# Patient Record
Sex: Female | Born: 1940 | Race: White | Hispanic: No | Marital: Married | Smoking: Former smoker
Health system: Southern US, Community
[De-identification: ages and names within clinical notes are randomized; demographics above are authoritative.]

## PROBLEM LIST (undated history)

## (undated) DIAGNOSIS — E785 Hyperlipidemia, unspecified: Secondary | ICD-10-CM

## (undated) DIAGNOSIS — I10 Essential (primary) hypertension: Secondary | ICD-10-CM

## (undated) HISTORY — PX: FRACTURE SURGERY: SHX138

---

## 2006-11-04 ENCOUNTER — Ambulatory Visit: Payer: Self-pay | Admitting: Internal Medicine

## 2009-09-11 ENCOUNTER — Ambulatory Visit: Payer: Self-pay | Admitting: Internal Medicine

## 2011-04-09 ENCOUNTER — Inpatient Hospital Stay: Payer: Self-pay | Admitting: Specialist

## 2011-10-09 ENCOUNTER — Ambulatory Visit: Payer: Self-pay | Admitting: Internal Medicine

## 2011-11-27 ENCOUNTER — Ambulatory Visit: Payer: Self-pay | Admitting: Internal Medicine

## 2011-11-28 ENCOUNTER — Ambulatory Visit: Payer: Self-pay | Admitting: Internal Medicine

## 2012-04-16 ENCOUNTER — Ambulatory Visit: Payer: Self-pay | Admitting: Ophthalmology

## 2012-04-16 LAB — POTASSIUM: Potassium: 4 mmol/L (ref 3.5–5.1)

## 2012-04-27 ENCOUNTER — Ambulatory Visit: Payer: Self-pay | Admitting: Ophthalmology

## 2012-12-03 ENCOUNTER — Ambulatory Visit: Payer: Self-pay | Admitting: Internal Medicine

## 2013-12-06 ENCOUNTER — Ambulatory Visit: Payer: Self-pay | Admitting: Internal Medicine

## 2015-04-02 NOTE — Op Note (Signed)
PATIENT NAME:  Jordan Armstrong, Jordan Armstrong MR#:  [id number] DATE OF BIRTH:  July 08, 1941  DATE OF PROCEDURE:  04/27/2012  PREOPERATIVE DIAGNOSIS: Cataract, right eye.   POSTOPERATIVE DIAGNOSIS: Cataract, right eye.   PROCEDURE PERFORMED: Extracapsular cataract extraction using phacoemulsification with placement of an Alcon [id number], 24-diopter posterior chamber lens, serial # [id number].   SURGEON: Maylon PeppersSteven A. Mabrey Howland, M.D.   ANESTHESIA: 4% lidocaine, 0.75% Marcaine in a 50/50 mixture with 10 units/mL of Hylenex added, given as a peribulbar.   ANESTHESIOLOGIST: Dr. Pernell DupreAdams   COMPLICATIONS: None.   ESTIMATED BLOOD LOSS: Less than 1 mL.   DESCRIPTION OF PROCEDURE:  The patient was brought to the operating room and given a peribulbar block.  The patient was then prepped and draped in the usual fashion.  The vertical rectus muscles were imbricated using 5-0 silk sutures.  These sutures were then clamped to the sterile drapes as bridle sutures.  A limbal peritomy was performed extending two clock hours and hemostasis was obtained with cautery.  A partial thickness scleral groove was made at the surgical limbus and dissected anteriorly in a lamellar dissection using an Alcon crescent knife.  The anterior chamber was entered superonasally with a Superblade and through the lamellar dissection with a 2.6 mm keratome.  DisCoVisc was used to replace the aqueous and a continuous tear capsulorrhexis was carried out.  Hydrodissection and hydrodelineation were carried out with balanced salt and a 27 gauge canula.  The nucleus was rotated to confirm the effectiveness of the hydrodissection.  Phacoemulsification was carried out using a divide-and-conquer technique.  Total ultrasound time was 1 minute and 18 seconds with an average power of 20.8 percent. CDE 28.72.  Irrigation/aspiration was used to remove the residual cortex.  DisCoVisc was used to inflate the capsule and the internal incision was enlarged to 3 mm with  the crescent knife.  The intraocular lens was folded and inserted into the capsular bag using the Acrysert delivery system.  Irrigation/aspiration was used to remove the residual DisCoVisc.  Miostat was injected into the anterior chamber through the paracentesis track to inflate the anterior chamber and induce miosis.  The wound was checked for leaks and none were found. The conjunctiva was closed with cautery and the bridle sutures were removed.  Two drops of 0.3% Vigamox were placed on the eye.   An eye shield was placed on the eye.  The patient was discharged to the recovery room in good condition.   ____________________________ Maylon PeppersSteven A. Tykwon Fera, MD sad:bjt D: 04/27/2012 12:49:57 ET T: 04/27/2012 13:07:07 ET JOB#: [id number]  cc: Viviann SpareSteven A. Mignonne Afonso, MD, <Dictator> Erline LevineSTEVEN A Lonzy Mato MD ELECTRONICALLY SIGNED 05/01/2012 12:02

## 2015-09-25 ENCOUNTER — Emergency Department
Admission: EM | Admit: 2015-09-25 | Discharge: 2015-09-25 | Disposition: A | Discharge status: Home / Self Care | Payer: Medicare Other | Attending: Emergency Medicine | Admitting: Emergency Medicine

## 2015-09-25 ENCOUNTER — Emergency Department: Payer: Medicare Other

## 2015-09-25 DIAGNOSIS — I10 Essential (primary) hypertension: Secondary | ICD-10-CM | POA: Insufficient documentation

## 2015-09-25 DIAGNOSIS — R410 Disorientation, unspecified: Secondary | ICD-10-CM | POA: Diagnosis not present

## 2015-09-25 DIAGNOSIS — R4182 Altered mental status, unspecified: Secondary | ICD-10-CM | POA: Diagnosis present

## 2015-09-25 DIAGNOSIS — Z87891 Personal history of nicotine dependence: Secondary | ICD-10-CM | POA: Insufficient documentation

## 2015-09-25 HISTORY — DX: Essential (primary) hypertension: I10

## 2015-09-25 HISTORY — DX: Hyperlipidemia, unspecified: E78.5

## 2015-09-25 LAB — CBC WITH DIFFERENTIAL/PLATELET
BASOS PCT: 1 %
Basophils Absolute: 0 10*3/uL (ref 0–0.1)
EOS ABS: 0.2 10*3/uL (ref 0–0.7)
EOS PCT: 4 %
HCT: 44.8 % (ref 35.0–47.0)
HEMOGLOBIN: 15.3 g/dL (ref 12.0–16.0)
LYMPHS ABS: 0.4 10*3/uL — AB (ref 1.0–3.6)
Lymphocytes Relative: 8 %
MCH: 31.9 pg (ref 26.0–34.0)
MCHC: 34.1 g/dL (ref 32.0–36.0)
MCV: 93.3 fL (ref 80.0–100.0)
Monocytes Absolute: 0.3 10*3/uL (ref 0.2–0.9)
Monocytes Relative: 6 %
NEUTROS PCT: 81 %
Neutro Abs: 4.6 10*3/uL (ref 1.4–6.5)
PLATELETS: 205 10*3/uL (ref 150–440)
RBC: 4.8 MIL/uL (ref 3.80–5.20)
RDW: 13.1 % (ref 11.5–14.5)
WBC: 5.7 10*3/uL (ref 3.6–11.0)

## 2015-09-25 LAB — COMPREHENSIVE METABOLIC PANEL
ALBUMIN: 4.2 g/dL (ref 3.5–5.0)
ALK PHOS: 77 U/L (ref 38–126)
ALT: 12 U/L — AB (ref 14–54)
ANION GAP: 9 (ref 5–15)
AST: 17 U/L (ref 15–41)
BUN: 12 mg/dL (ref 6–20)
CHLORIDE: 98 mmol/L — AB (ref 101–111)
CO2: 28 mmol/L (ref 22–32)
Calcium: 11 mg/dL — ABNORMAL HIGH (ref 8.9–10.3)
Creatinine, Ser: 1.1 mg/dL — ABNORMAL HIGH (ref 0.44–1.00)
GFR calc non Af Amer: 48 mL/min — ABNORMAL LOW (ref >60)
GFR, EST AFRICAN AMERICAN: 56 mL/min — AB (ref >60)
GLUCOSE: 105 mg/dL — AB (ref 65–99)
Potassium: 3.9 mmol/L (ref 3.5–5.1)
SODIUM: 135 mmol/L (ref 135–145)
Total Bilirubin: 1.1 mg/dL (ref 0.3–1.2)
Total Protein: 7.2 g/dL (ref 6.5–8.1)

## 2015-09-25 LAB — URINALYSIS COMPLETE WITH MICROSCOPIC (ARMC ONLY)
BILIRUBIN URINE: NEGATIVE
Bacteria, UA: NONE SEEN
GLUCOSE, UA: NEGATIVE mg/dL
Hgb urine dipstick: NEGATIVE
Ketones, ur: NEGATIVE mg/dL
LEUKOCYTES UA: NEGATIVE
NITRITE: NEGATIVE
Protein, ur: NEGATIVE mg/dL
RBC / HPF: NONE SEEN RBC/hpf (ref 0–5)
Specific Gravity, Urine: 1.017 (ref 1.005–1.030)
pH: 6 (ref 5.0–8.0)

## 2015-09-25 LAB — TROPONIN I: Troponin I: <0.03 ng/mL (ref <0.031)

## 2015-09-25 NOTE — ED Notes (Addendum)
Pt here with daughter N law who states the pt is confused, lives with her husband, the pt states "I thought he was dead", husband is alive and was at the home today..states she has had confusion for the past 2 weeks and pt refuses to see her PCP.Marland Kitchen. Pt denies pain or other sx.

## 2015-09-25 NOTE — Discharge Instructions (Signed)
Please seek medical attention for any high fevers, chest pain, shortness of breath, change in behavior, persistent vomiting, bloody stool or any other new or concerning symptoms. ° ° °Confusion °Confusion is the inability to think with your usual speed or clarity. Confusion may come on quickly or slowly over time. How quickly the confusion comes on depends on the cause. Confusion can be due to any number of causes. °CAUSES  °· Concussion, head injury, or head trauma. °· Seizures. °· Stroke. °· Fever. °· Brain tumor. °· Age related decreased brain function (dementia). °· Heightened emotional states like rage or terror. °· Mental illness in which the person loses the ability to determine what is real and what is not (hallucinations). °· Infections such as a urinary tract infection (UTI). °· Toxic effects from alcohol, drugs, or prescription medicines. °· Dehydration and an imbalance of salts in the body (electrolytes). °· Lack of sleep. °· Low blood sugar (diabetes). °· Low levels of oxygen from conditions such as chronic lung disorders. °· Drug interactions or other medicine side effects. °· Nutritional deficiencies, especially niacin, thiamine, vitamin C, or vitamin B. °· Sudden drop in body temperature (hypothermia). °· Change in routine, such as when traveling or hospitalized. °SIGNS AND SYMPTOMS  °People often describe their thinking as cloudy or unclear when they are confused. Confusion can also include feeling disoriented. That means you are unaware of where or who you are. You may also not know what the date or time is. If confused, you may also have difficulty paying attention, remembering, and making decisions. Some people also act aggressively when they are confused.  °DIAGNOSIS  °The medical evaluation of confusion may include: °· Blood and urine tests. °· X-rays. °· Brain and nervous system tests. °· Analyzing your brain waves (electroencephalogram or EEG). °· Magnetic resonance imaging (MRI) of your  head. °· Computed tomography (CT) scan of your head. °· Mental status tests in which your health care provider may ask many questions. Some of these questions may seem silly or strange, but they are a very important test to help diagnose and treat confusion. °TREATMENT  °An admission to the hospital may not be needed, but a person with confusion should not be left alone. Stay with a family member or friend until the confusion clears. Avoid alcohol, pain relievers, or sedative drugs until you have fully recovered. Do not drive until directed by your health care provider. °HOME CARE INSTRUCTIONS  °What family and friends can do: °· To find out if someone is confused, ask the person to state his or her name, age, and the date. If the person is unsure or answers incorrectly, he or she is confused. °· Always introduce yourself, no matter how well the person knows you. °· Often remind the person of his or her location. °· Place a calendar and clock near the confused person. °· Help the person with his or her medicines. You may want to use a pill box, an alarm as a reminder, or give the person each dose as prescribed. °· Talk about current events and plans for the day. °· Try to keep the environment calm, quiet, and peaceful. °· Make sure the person keeps follow-up visits with his or her health care provider. °PREVENTION  °Ways to prevent confusion: °· Avoid alcohol. °· Eat a balanced diet. °· Get enough sleep. °· Take medicine only as directed by your health care provider. °· Do not become isolated. Spend time with other people and make plans for your days. °·   Keep careful watch on your blood sugar levels if you are diabetic. SEEK IMMEDIATE MEDICAL CARE IF:   You develop severe headaches, repeated vomiting, seizures, blackouts, or slurred speech.  There is increasing confusion, weakness, numbness, restlessness, or personality changes.  You develop a loss of balance, have marked dizziness, feel uncoordinated, or  fall.  You have delusions, hallucinations, or develop severe anxiety.  Your family members think you need to be rechecked.   This information is not intended to replace advice given to you by your health care provider. Make sure you discuss any questions you have with your health care provider.   Document Released: 01/02/2005 Document Revised: 12/16/2014 Document Reviewed: 12/31/2013 Elsevier Interactive Patient Education 2016 Elsevier Inc. Dementia Dementia is a general term for problems with brain function. A person with dementia has memory loss and a hard time with at least one other brain function such as thinking, speaking, or problem solving. Dementia can affect social functioning, how you do your job, your mood, or your personality. The changes may be hidden for a long time. The earliest forms of this disease are usually not detected by family or friends. Dementia can be:  Irreversible.  Potentially reversible.  Partially reversible.  Progressive. This means it can get worse over time. CAUSES  Irreversible dementia causes may include:  Degeneration of brain cells (Alzheimer disease or Lewy body dementia).  Multiple small strokes (vascular dementia).  Infection (chronic meningitis or Creutzfeldt-Jakob disease).  Frontotemporal dementia. This affects younger people, age 69 to 17, compared to those who have Alzheimer disease.  Dementia associated with other disorders like Parkinson disease, Huntington disease, or HIV-associated dementia. Potentially or partially reversible dementia causes may include:  Medicines.  Metabolic causes such as excessive alcohol intake, vitamin B12 deficiency, or thyroid disease.  Masses or pressure in the brain such as a tumor, blood clot, or hydrocephalus. SIGNS AND SYMPTOMS  Symptoms are often hard to detect. Family members or coworkers may not notice them early in the disease process. Different people with dementia may have different  symptoms. Symptoms can include:  A hard time with memory, especially recent memory. Long-term memory may not be impaired.  Asking the same question multiple times or forgetting something someone just said.  A hard time speaking your thoughts or finding certain words.  A hard time solving problems or performing familiar tasks (such as how to use a telephone).  Sudden changes in mood.  Changes in personality, especially increasing moodiness or mistrust.  Depression.  A hard time understanding complex ideas that were never a problem in the past. DIAGNOSIS  There are no specific tests for dementia.   Your health care provider may recommend a thorough evaluation. This is because some forms of dementia can be reversible. The evaluation will likely include a physical exam and getting a detailed history from you and a family member. The history often gives the best clues and suggestions for a diagnosis.  Memory testing may be done. A detailed brain function evaluation called neuropsychologic testing may be helpful.  Lab tests and brain imaging (such as a CT scan or MRI scan) are sometimes important.  Sometimes observation and re-evaluation over time is very helpful. TREATMENT  Treatment depends on the cause.   If the problem is a vitamin deficiency, it may be helped or cured with supplements.  For dementias such as Alzheimer disease, medicines are available to stabilize or slow the course of the disease. There are no cures for this type of dementia.  Your health care provider can help direct you to groups, organizations, and other health care providers to help with decisions in the care of you or your loved one. HOME CARE INSTRUCTIONS The care of individuals with dementia is varied and dependent upon the progression of the dementia. The following suggestions are intended for the person living with, or caring for, the person with dementia.  Create a safe environment.  Remove the locks  on bathroom doors to prevent the person from accidentally locking himself or herself in.  Use childproof latches on kitchen cabinets and any place where cleaning supplies, chemicals, or alcohol are kept.  Use childproof covers in unused electrical outlets.  Install childproof devices to keep doors and windows secured.  Remove stove knobs or install safety knobs and an automatic shut-off on the stove.  Lower the temperature on water heaters.  Label medicines and keep them locked up.  Secure knives, lighters, matches, power tools, and guns, and keep these items out of reach.  Keep the house free from clutter. Remove rugs or anything that might contribute to a fall.  Remove objects that might break and hurt the person.  Make sure lighting is good, both inside and outside.  Install grab rails as needed.  Use a monitoring device to alert you to falls or other needs for help.  Reduce confusion.  Keep familiar objects and people around.  Use night lights or dim lights at night.  Label items or areas.  Use reminders, notes, or directions for daily activities or tasks.  Keep a simple, consistent routine for waking, meals, bathing, dressing, and bedtime.  Create a calm, quiet environment.  Place large clocks and calendars prominently.  Display emergency numbers and home address near all telephones.  Use cues to establish different times of the day. An example is to open curtains to let the natural light in during the day.   Use effective communication.  Choose simple words and short sentences.  Use a gentle, calm tone of voice.  Be careful not to interrupt.  If the person is struggling to find a word or communicate a thought, try to provide the word or thought.  Ask one question at a time. Allow the person ample time to answer questions. Repeat the question again if the person does not respond.  Reduce nighttime restlessness.  Provide a comfortable bed.  Have a  consistent nighttime routine.  Ensure a regular walking or physical activity schedule. Involve the person in daily activities as much as possible.  Limit napping during the day.  Limit caffeine.  Attend social events that stimulate rather than overwhelm the senses.  Encourage good nutrition and hydration.  Reduce distractions during meal times and snacks.  Avoid foods that are too hot or too cold.  Monitor chewing and swallowing ability.  Continue with routine vision, hearing, dental, and medical screenings.  Give medicines only as directed by the health care provider.  Monitor driving abilities. Do not allow the person to drive when safe driving is no longer possible.  Register with an identification program which could provide location assistance in the event of a missing person situation. SEEK MEDICAL CARE IF:   New behavioral problems start such as moodiness, aggressiveness, or seeing things that are not there (hallucinations).  Any new problem with brain function happens. This includes problems with balance, speech, or falling a lot.  Problems with swallowing develop.  Any symptoms of other illness happen. Small changes or worsening in any aspect of  brain function can be a sign that the illness is getting worse. It can also be a sign of another medical illness such as infection. Seeing a health care provider right away is important. SEEK IMMEDIATE MEDICAL CARE IF:   A fever develops.  New or worsened confusion develops.  New or worsened sleepiness develops.  Staying awake becomes hard to do.   This information is not intended to replace advice given to you by your health care provider. Make sure you discuss any questions you have with your health care provider.   Document Released: 05/21/2001 Document Revised: 12/16/2014 Document Reviewed: 04/22/2011 Elsevier Interactive Patient Education [employer] Elsevier Inc.

## 2015-09-25 NOTE — ED Notes (Signed)
Patient in room with family and nurse Lana patient refused ekg at this time. Will come back again.

## 2015-09-25 NOTE — ED Provider Notes (Signed)
[facility] Emergency Department Provider Note    ____________________________________________  Time seen: 1205  I have reviewed the triage vital signs and the nursing notes.   HISTORY  Chief Complaint Altered Mental Status   History limited by: Altered Mental Status   HPI Jordan Armstrong is a 74 y.o. female who is brought to the emergency department today by family because of concern for confusion. Per family the patient has been confused for the past 2-3 weeks. Her main point of confusion is that her husband is missing, when the family states that the husband has actually been living at home. The family and patient deny any other symptoms. They deny any falls or trauma to her head.    Past Medical History  Diagnosis Date  . Hyperlipidemia   . Hypertension     There are no active problems to display for this patient.   Past Surgical History  Procedure Laterality Date  . Fracture surgery      No current outpatient prescriptions on file.  Allergies Review of patient's allergies indicates no known allergies.  No family history on file.  Social History Social History  Substance Use Topics  . Smoking status: Former Games developermoker  . Smokeless tobacco: None  . Alcohol Use: No    Review of Systems  Constitutional: Negative for fever. Cardiovascular: Negative for chest pain. Respiratory: Negative for shortness of breath. Gastrointestinal: Negative for abdominal pain, vomiting and diarrhea. Genitourinary: Negative for dysuria. Musculoskeletal: Negative for back pain. Skin: Negative for rash. Neurological: Negative for headaches, focal weakness or numbness.  10-point ROS otherwise negative.  ____________________________________________   PHYSICAL EXAM:  VITAL SIGNS: ED Triage Vitals  Enc Vitals Group     BP 09/25/15 1104 184/72 mmHg     Pulse Rate 09/25/15 1104 55     Resp 09/25/15 1104 16     Temp 09/25/15 1104 97.8 F (36.6 C)      Temp Source 09/25/15 1104 Oral     SpO2 09/25/15 1104 94 %     Weight 09/25/15 1104 120 lb (54.432 kg)     Height 09/25/15 1104 5\' 4"  (1.626 m)   Constitutional: Alert and oriented to date and place, not event that brought her here. No acute distress.  Eyes: Conjunctivae are normal. PERRL. Normal extraocular movements. ENT   Head: Normocephalic and atraumatic.   Nose: No congestion/rhinnorhea.   Mouth/Throat: Mucous membranes are moist.   Neck: No stridor. Hematological/Lymphatic/Immunilogical: No cervical lymphadenopathy. Cardiovascular: Normal rate, regular rhythm.  No murmurs, rubs, or gallops. Respiratory: Normal respiratory effort without tachypnea nor retractions. Breath sounds are clear and equal bilaterally. No wheezes/rales/rhonchi. Gastrointestinal: Soft and nontender. No distention.  Genitourinary: Deferred Musculoskeletal: Normal range of motion in all extremities. No joint effusions.  No lower extremity tenderness nor edema. Neurologic:  Normal speech and language. No gross focal neurologic deficits are appreciated. Speech is normal.  Disoriented to events. Skin:  Skin is warm, dry and intact. No rash noted. Psychiatric: Mood and affect are normal. Speech and behavior are normal. Patient exhibits appropriate insight and judgment.  ____________________________________________    LABS (pertinent positives/negatives)  Labs Reviewed  CBC WITH DIFFERENTIAL/PLATELET - Abnormal; Notable for the following:    Lymphs Abs 0.4 (*)    All other components within normal limits  COMPREHENSIVE METABOLIC PANEL - Abnormal; Notable for the following:    Chloride 98 (*)    Glucose, Bld 105 (*)    Creatinine, Ser 1.10 (*)    Calcium 11.0 (*)  ALT 12 (*)    GFR calc non Af Amer 48 (*)    GFR calc Af Amer 56 (*)    All other components within normal limits  URINALYSIS COMPLETEWITH MICROSCOPIC ([facility] ONLY) - Abnormal; Notable for the following:    Color, Urine YELLOW (*)     APPearance CLEAR (*)    Squamous Epithelial / LPF 0-5 (*)    All other components within normal limits  TROPONIN I  CBG MONITORING, ED     ____________________________________________   EKG  I, Phineas Semen, attending physician, personally viewed and interpreted this EKG  EKG Time: 1433 Rate: 57 Rhythm: sinus bradycardia Axis: normal Intervals: qtc 401 QRS: narrow ST changes: no st elevation Impression: sinus bradycardia otherwise normal ekg  ____________________________________________    RADIOLOGY  CT Head  IMPRESSION: No acute finding.  Senescent changes described above.  CXR  IMPRESSION: 1. No acute finding. 2. COPD.  I, Anner Baity, personally viewed and evaluated these images (plain radiographs) as part of my medical decision making. ____________________________________________   PROCEDURES  Procedure(s) performed: None  Critical Care performed: No  ____________________________________________   INITIAL IMPRESSION / ASSESSMENT AND PLAN / ED COURSE  Pertinent labs & imaging results that were available during my care of the patient were reviewed by me and considered in my medical decision making (see chart for details).  Patient brought in by family today because of concerns for confusion that is progressive over the past couple of weeks. On exam here patient disoriented to events however oriented to time and place. After some discussion patient did agree to undergo evaluation. Blood work head CT and chest x-ray without any concerning findings. Urine within normal limits. At this point I don't have a great explanation for the occurrence confusion been noted by the family. Did recommend patient follow up with primary care doctor. I do wonder if this is dementia.  ____________________________________________   FINAL CLINICAL IMPRESSION(S) / ED DIAGNOSES  Final diagnoses:  Confusion     Phineas Semen, MD 09/25/15 1649

## 2015-10-16 ENCOUNTER — Encounter: Payer: Self-pay | Admitting: Family Medicine

## 2015-10-16 ENCOUNTER — Ambulatory Visit (INDEPENDENT_AMBULATORY_CARE_PROVIDER_SITE_OTHER): Payer: Medicare Other | Admitting: Family Medicine

## 2015-10-16 VITALS — BP 136/80 | HR 61 | Ht 61.5 in | Wt 134.0 lb

## 2015-10-16 DIAGNOSIS — Z23 Encounter for immunization: Secondary | ICD-10-CM | POA: Diagnosis not present

## 2015-10-16 DIAGNOSIS — E785 Hyperlipidemia, unspecified: Secondary | ICD-10-CM

## 2015-10-16 DIAGNOSIS — F0390 Unspecified dementia without behavioral disturbance: Secondary | ICD-10-CM | POA: Insufficient documentation

## 2015-10-16 DIAGNOSIS — I10 Essential (primary) hypertension: Secondary | ICD-10-CM

## 2015-10-16 DIAGNOSIS — F0391 Unspecified dementia with behavioral disturbance: Secondary | ICD-10-CM

## 2015-10-16 NOTE — Assessment & Plan Note (Signed)
Under fair control today.

## 2015-10-16 NOTE — Assessment & Plan Note (Signed)
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 She does not know her husband, has threatened suicide, but has no plan. Is wandering at night without proper clothing along a road. Does not feel safe in her house. I discussed with her daughter and her daughter-in-law that this is not safe. That I cannot medicate her or work her up without her cooperation and knowing what's going on. She walked out of the exam room during the interview and refused to return. Discussed with her children that [facility] in [facility] ger[facility]. They stated that they will take her. I do not think she is competent to make her own decisions at this time. Adult protective services called and a report made to them. No charge for this appointment as we were not able to establish a doctor-patient relationship and she stated that she would not be returning.

## 2015-10-16 NOTE — Assessment & Plan Note (Signed)
Has been on lovastatin. Checking levels today.
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
## 2015-10-17 ENCOUNTER — Telehealth: Payer: Self-pay | Admitting: Family Medicine

## 2015-10-17 ENCOUNTER — Encounter: Payer: Self-pay | Admitting: Family Medicine

## 2015-10-17 LAB — CBC WITH DIFFERENTIAL/PLATELET
BASOS: 1 %
Basophils Absolute: 0 10*3/uL (ref 0.0–0.2)
EOS (ABSOLUTE): 0.5 10*3/uL — ABNORMAL HIGH (ref 0.0–0.4)
EOS: 8 %
HEMATOCRIT: 43 % (ref 34.0–46.6)
HEMOGLOBIN: 14.3 g/dL (ref 11.1–15.9)
Immature Grans (Abs): 0 10*3/uL (ref 0.0–0.1)
Immature Granulocytes: 0 %
LYMPHS ABS: 0.7 10*3/uL (ref 0.7–3.1)
Lymphs: 11 %
MCH: 32.1 pg (ref 26.6–33.0)
MCHC: 33.3 g/dL (ref 31.5–35.7)
MCV: 97 fL (ref 79–97)
MONOCYTES: 5 %
MONOS ABS: 0.3 10*3/uL (ref 0.1–0.9)
NEUTROS ABS: 4.5 10*3/uL (ref 1.4–7.0)
Neutrophils: 75 %
Platelets: 288 10*3/uL (ref 150–379)
RBC: 4.45 x10E6/uL (ref 3.77–5.28)
RDW: 13.5 % (ref 12.3–15.4)
WBC: 6.1 10*3/uL (ref 3.4–10.8)

## 2015-10-17 LAB — COMPREHENSIVE METABOLIC PANEL
ALBUMIN: 4 g/dL (ref 3.5–4.8)
ALK PHOS: 84 IU/L (ref 39–117)
ALT: 11 IU/L (ref 0–32)
AST: 15 IU/L (ref 0–40)
Albumin/Globulin Ratio: 1.8 (ref 1.1–2.5)
BUN / CREAT RATIO: 10 — AB (ref 11–26)
BUN: 13 mg/dL (ref 8–27)
Bilirubin Total: 0.4 mg/dL (ref 0.0–1.2)
CALCIUM: 9.9 mg/dL (ref 8.7–10.3)
CO2: 26 mmol/L (ref 18–29)
CREATININE: 1.25 mg/dL — AB (ref 0.57–1.00)
Chloride: 99 mmol/L (ref 97–106)
GFR, EST AFRICAN AMERICAN: 49 mL/min/{1.73_m2} — AB (ref >59)
GFR, EST NON AFRICAN AMERICAN: 42 mL/min/{1.73_m2} — AB (ref >59)
GLOBULIN, TOTAL: 2.2 g/dL (ref 1.5–4.5)
Glucose: 101 mg/dL — ABNORMAL HIGH (ref 65–99)
Potassium: 4.4 mmol/L (ref 3.5–5.2)
SODIUM: 138 mmol/L (ref 136–144)
TOTAL PROTEIN: 6.2 g/dL (ref 6.0–8.5)

## 2015-10-17 LAB — LIPID PANEL W/O CHOL/HDL RATIO
Cholesterol, Total: 170 mg/dL (ref 100–199)
HDL: 41 mg/dL (ref >39)
LDL CALC: 83 mg/dL (ref 0–99)
Triglycerides: 230 mg/dL — ABNORMAL HIGH (ref 0–149)
VLDL CHOLESTEROL CAL: 46 mg/dL — AB (ref 5–40)

## 2015-10-17 LAB — VITAMIN B12: VITAMIN B 12: 348 pg/mL (ref 211–946)

## 2015-10-17 LAB — FOLATE: FOLATE: 13.5 ng/mL (ref >3.0)

## 2015-10-17 LAB — TSH: TSH: 2.27 u[IU]/mL (ref 0.450–4.500)

## 2015-10-17 NOTE — Telephone Encounter (Signed)
error 

## 2015-12-15 ENCOUNTER — Telehealth: Payer: Self-pay

## 2015-12-15 NOTE — Telephone Encounter (Signed)
Called and left a voicemail to notify worker that Dr.Johnson is not able to do this.

## 2015-12-15 NOTE — Telephone Encounter (Signed)
I cannot do this unfortunately, I do not know the patient, and she really didn't have a real visit with me.

## 2015-12-15 NOTE — Telephone Encounter (Signed)
[facility]  Adult protective services called, they are going to get custody of patient. They wanted to know if you would write a letter outlining her capacity to take care of herself. I informed her that you had only seen the patient once and that she was a brand new patient at that time, and that I was unsure if this is something that you could do for them.

## 2015-12-27 ENCOUNTER — Telehealth: Payer: Self-pay | Admitting: Family Medicine

## 2016-02-09 ENCOUNTER — Ambulatory Visit (INDEPENDENT_AMBULATORY_CARE_PROVIDER_SITE_OTHER): Payer: Medicare Other | Admitting: Sports Medicine

## 2016-02-09 ENCOUNTER — Encounter: Payer: Self-pay | Admitting: Sports Medicine

## 2016-02-09 DIAGNOSIS — M79671 Pain in right foot: Secondary | ICD-10-CM

## 2016-02-09 DIAGNOSIS — M79672 Pain in left foot: Secondary | ICD-10-CM | POA: Diagnosis not present

## 2016-02-09 DIAGNOSIS — B351 Tinea unguium: Secondary | ICD-10-CM

## 2016-02-09 NOTE — Progress Notes (Signed)
Patient ID: Jordan GuyLinda P Almquist, female   DOB: Sep 19, 1941, 75 y.o.   MRN: [id number] Subjective: Jordan GuyLinda P Pascua is a 75 y.o. female patient seen today in office with complaint of painful thickened and elongated toenails; unable to trim. Patient denies history of known Diabetes, Neuropathy, or Vascular disease. Patient has no other pedal complaints at this time.   Patient is assisted by Daughter this visit.   Patient Active Problem List   Diagnosis Date Noted  . Dementia 10/16/2015  . Hyperlipidemia 10/16/2015  . HTN (hypertension) 10/16/2015    Current Outpatient Prescriptions on File Prior to Visit  Medication Sig Dispense Refill  . lovastatin (MEVACOR) 40 MG tablet Take 40 mg by mouth daily.  3  . triamterene-hydrochlorothiazide (MAXZIDE-25) 37.5-25 MG tablet Take 1 tablet by mouth daily.  4   No current facility-administered medications on file prior to visit.    No Known Allergies  Objective: Physical Exam  General: Well developed, nourished, no acute distress, awake, alert and oriented x 3  Vascular: Dorsalis pedis artery 1/4 bilateral, Posterior tibial artery 1/4 bilateral, skin temperature warm to warm proximal to distal bilateral lower extremities, + varicosities, decreased pedal hair present bilateral.  Neurological: Gross sensation present via light touch bilateral.   Dermatological: Skin is warm, dry, and supple bilateral, Nails 1-10 are tender, long, thick, and discolored with mild subungal debris, no webspace macerations present bilateral, no open lesions present bilateral, no callus/corns/hyperkeratotic tissue present bilateral. No signs of infection bilateral.  Musculoskeletal: Asymptomatic hammertoe boney deformities noted bilateral. Muscular strength within normal limits without pain on range of motion. No pain with calf compression bilateral.  Assessment and Plan:  Problem List Items Addressed This Visit    None    Visit Diagnoses    Dermatophytosis of nail     -  Primary    Foot pain, bilateral          -Examined patient.  -Discussed treatment options for painful mycotic nails. -Mechanically debrided and reduced mycotic nails with sterile nail nipper and dremel nail file without incident. -Patient to return in 3 months for follow up evaluation or sooner if symptoms worsen.  Asencion Islamitorya Merril Nagy, DPM

## 2016-02-16 NOTE — Telephone Encounter (Signed)
done

## 2016-05-17 ENCOUNTER — Ambulatory Visit: Payer: Medicare Other | Admitting: Sports Medicine

## 2016-05-21 ENCOUNTER — Encounter: Payer: Self-pay | Admitting: Sports Medicine

## 2016-05-21 ENCOUNTER — Ambulatory Visit (INDEPENDENT_AMBULATORY_CARE_PROVIDER_SITE_OTHER): Payer: Medicare Other | Admitting: Sports Medicine

## 2016-05-21 DIAGNOSIS — M79671 Pain in right foot: Secondary | ICD-10-CM | POA: Diagnosis not present

## 2016-05-21 DIAGNOSIS — M79672 Pain in left foot: Secondary | ICD-10-CM | POA: Diagnosis not present

## 2016-05-21 DIAGNOSIS — B351 Tinea unguium: Secondary | ICD-10-CM | POA: Diagnosis not present

## 2016-05-21 NOTE — Progress Notes (Signed)
Patient ID: Jordan Armstrong, female   DOB: [birth year], 75 y.o.   MRN: [id number]   Subjective: Jordan Armstrong is a 75 y.o. female patient seen today in office with complaint of painful thickened and elongated toenails; unable to trim. Patient denies any changes with medical history since last visit. Patient has no other pedal complaints at this time.   Patient is assisted by Daughter this visit.   Patient Active Problem List   Diagnosis Date Noted  . Dementia 10/16/2015  . Hyperlipidemia 10/16/2015  . HTN (hypertension) 10/16/2015    Current Outpatient Prescriptions on File Prior to Visit  Medication Sig Dispense Refill  . atenolol (TENORMIN) 50 MG tablet Take 50 mg by mouth daily.  3  . lovastatin (MEVACOR) 40 MG tablet Take 40 mg by mouth daily.  3  . triamterene-hydrochlorothiazide (MAXZIDE-25) 37.5-25 MG tablet Take 1 tablet by mouth daily.  4   No current facility-administered medications on file prior to visit.    No Known Allergies  Objective: Physical Exam  General: Well developed, nourished, no acute distress, awake, alert and oriented x 3  Vascular: Dorsalis pedis artery 1/4 bilateral, Posterior tibial artery 1/4 bilateral, skin temperature warm to warm proximal to distal bilateral lower extremities, + varicosities, decreased pedal hair present bilateral.  Neurological: Gross sensation present via light touch bilateral.   Dermatological: Skin is warm, dry, and supple bilateral, Nails 1-10 are tender, long, thick, and discolored with mild subungal debris, no webspace macerations present bilateral, no open lesions present bilateral, minimal hyperkeratotic tissue present right 3rd toe and sub met 3. No signs of infection bilateral.  Musculoskeletal: Asymptomatic hammertoe boney deformities noted bilateral. Muscular strength within normal limits without pain on range of motion. No pain with calf compression bilateral.  Assessment and Plan:  Problem List Items Addressed  This Visit    None    Visit Diagnoses    Dermatophytosis of nail    -  Primary    Foot pain, bilateral          -Examined patient.  -Discussed treatment options for painful mycotic nails. -Mechanically debrided and reduced mycotic nails with sterile nail nipper and dremel nail file without incident. -Recommend daily skin emolleints -Patient to return in 3 months for follow up evaluation or sooner if symptoms worsen.  Landis Martins, DPM

## 2016-06-10 IMAGING — CT CT HEAD W/O CM
1 series · 16 of 30 positions shown, 20 images · non-contrast
Comparison: None.

CLINICAL DATA: Altered mental status

EXAM:
CT HEAD WITHOUT CONTRAST
TECHNIQUE: Contiguous axial images were obtained from the base of the skull
through the vertex without intravenous contrast.

[Series 2: head wo · axial · 0.41mm/px · z∈[-153,-18]mm · 16 of 30 slices shown, 20 images]
[im 2/30  brain]
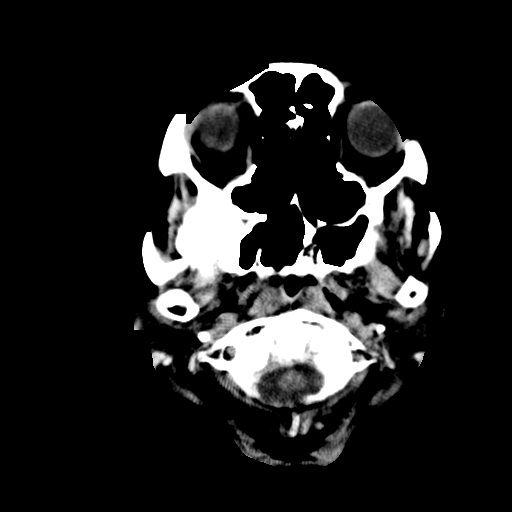
[im 2/30  bone]
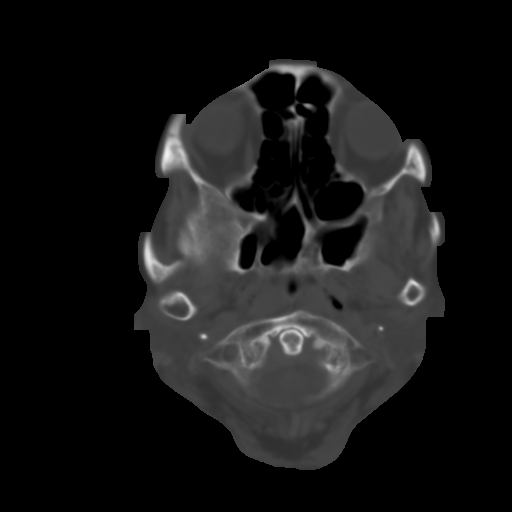
[im 4/30  brain]
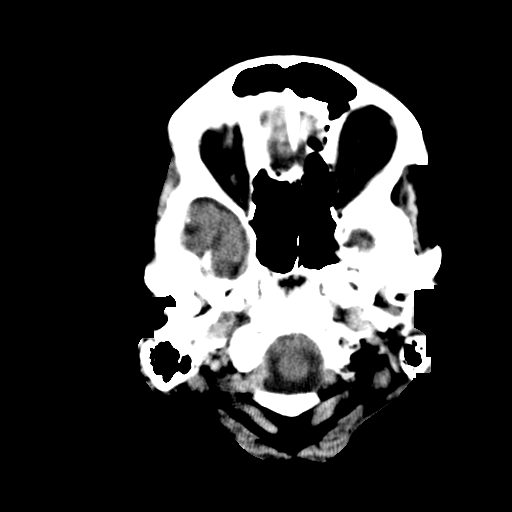
[im 6/30  brain]
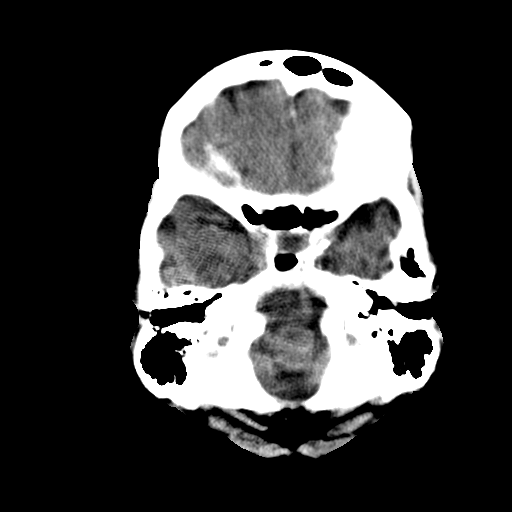
[im 8/30  brain]
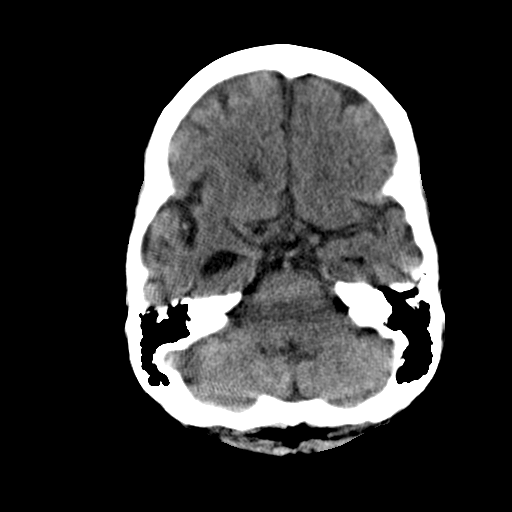
[im 9/30  brain]
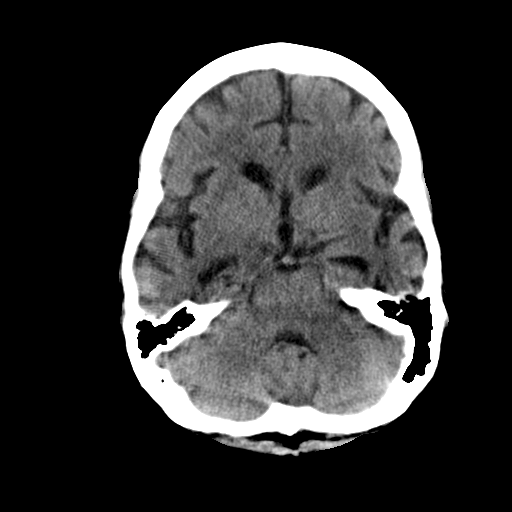
[im 9/30  bone]
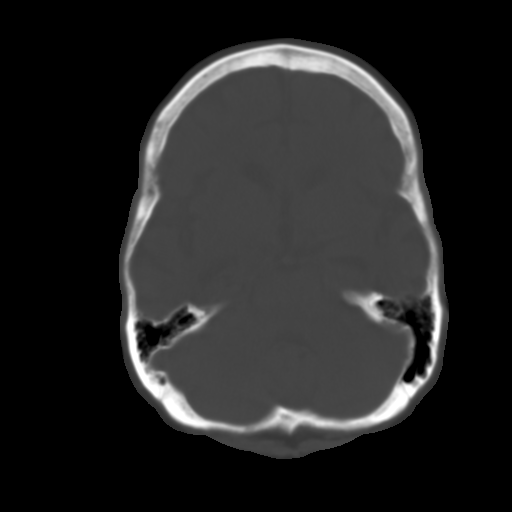
[im 11/30  brain]
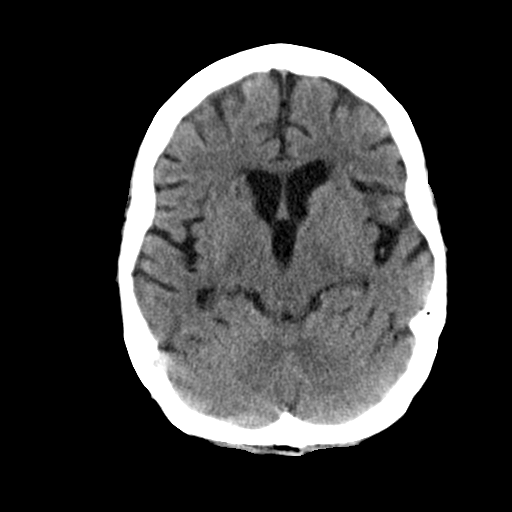
[im 13/30  brain]
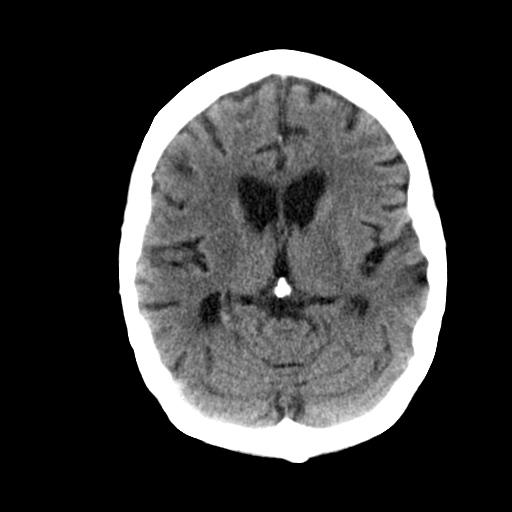
[im 15/30  brain]
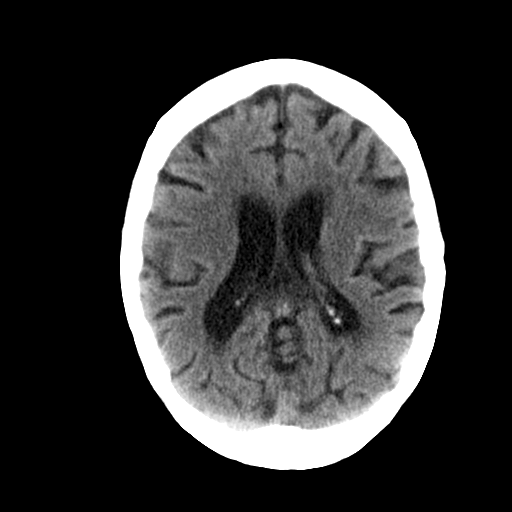
[im 16/30  brain]
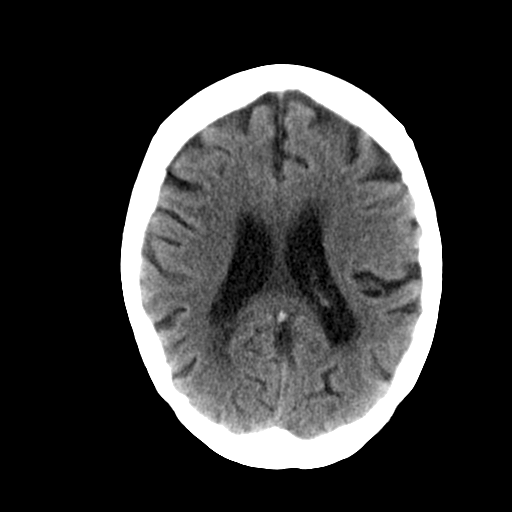
[im 16/30  bone]
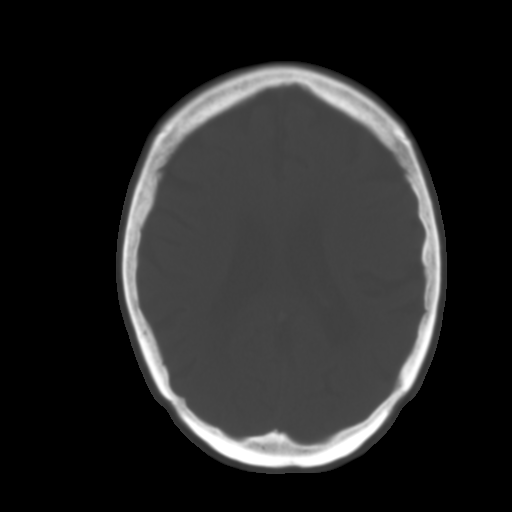
[im 18/30  brain]
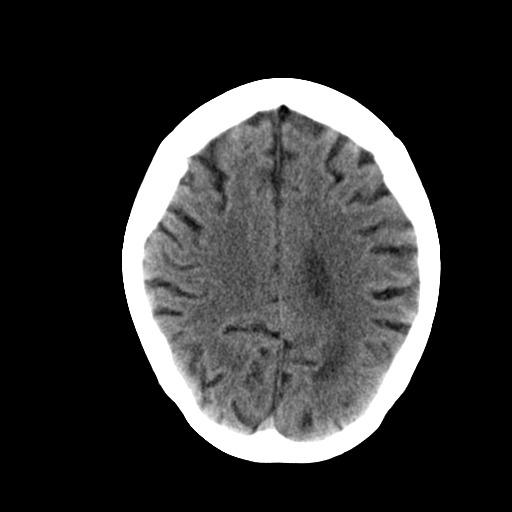
[im 20/30  brain]
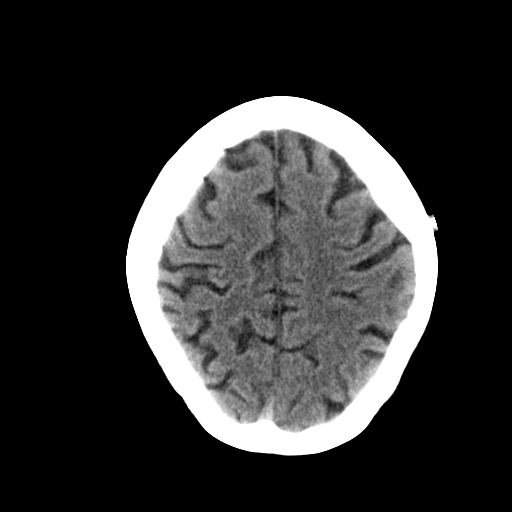
[im 22/30  brain]
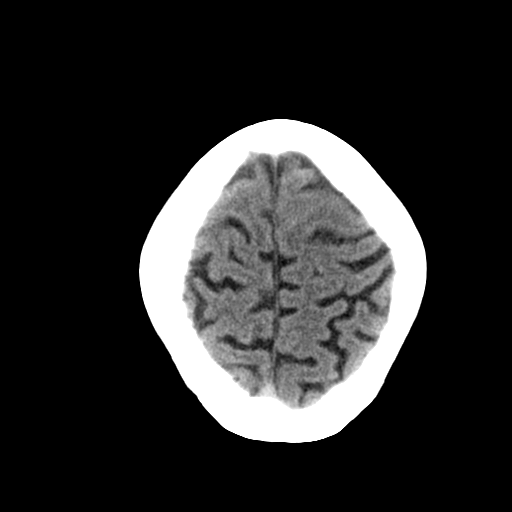
[im 23/30  brain]
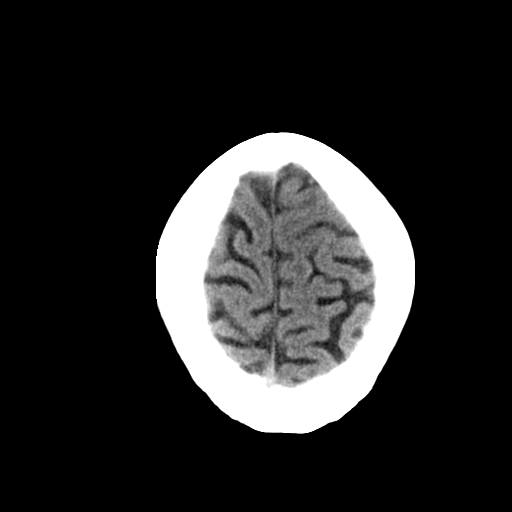
[im 23/30  bone]
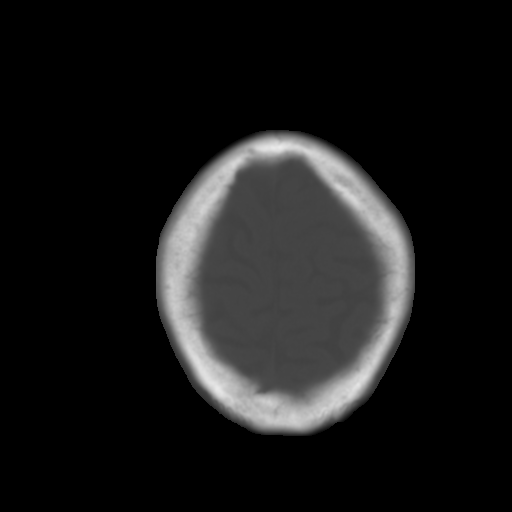
[im 25/30  brain]
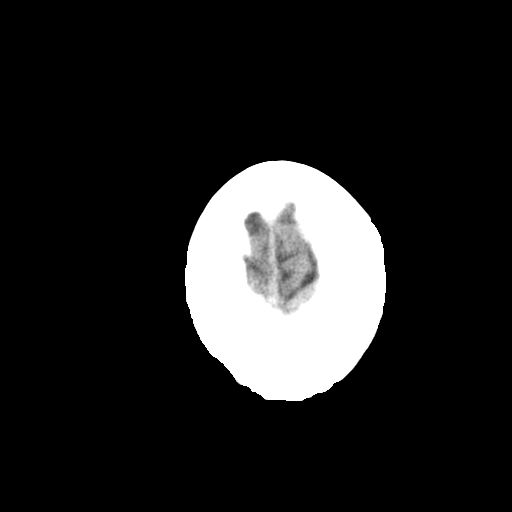
[im 27/30  brain]
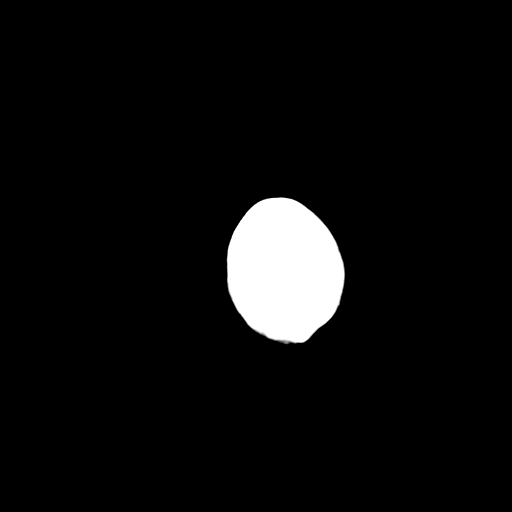
[im 29/30  brain]
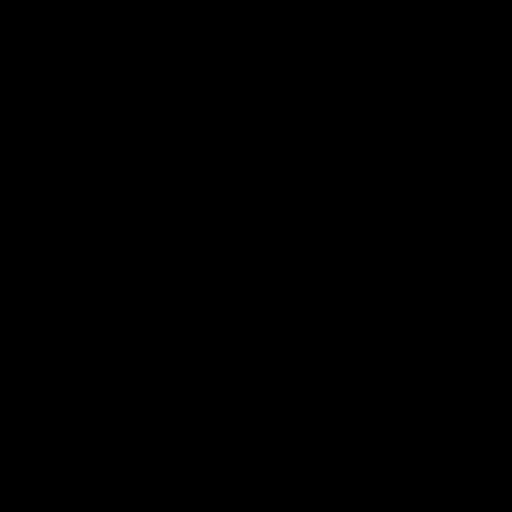

[16 of 30 positions shown; findings below may reference images not displayed]

FINDINGS: Skull and Sinuses:Negative for fracture or destructive process. The
mastoids, middle ears, and imaged paranasal sinuses are clear.

Orbits: No acute abnormality.

Brain: No evidence of acute infarction, hemorrhage, extra-axial
collection, hydrocephalus, or mass lesion/mass effect.

Remote lacunar infarct in the right caudate head. Mild for age
small-vessel ischemic gliosis in the bilateral deep cerebral white
matter. Generalized atrophy, age congruent.
IMPRESSION: No acute finding.

Senescent changes described above.

## 2016-08-27 ENCOUNTER — Ambulatory Visit: Payer: Medicare Other | Admitting: Sports Medicine

## 2016-11-08 DEATH — deceased
